# Patient Record
Sex: Female | Born: 1935 | Hispanic: No | Marital: Married
Health system: Southern US, Community
[De-identification: ages and names within clinical notes are randomized; demographics above are authoritative.]

## PROBLEM LIST (undated history)

## (undated) DIAGNOSIS — F329 Major depressive disorder, single episode, unspecified: Secondary | ICD-10-CM

## (undated) DIAGNOSIS — I10 Essential (primary) hypertension: Secondary | ICD-10-CM

## (undated) DIAGNOSIS — R609 Edema, unspecified: Secondary | ICD-10-CM

## (undated) DIAGNOSIS — F32A Depression, unspecified: Secondary | ICD-10-CM

## (undated) DIAGNOSIS — M109 Gout, unspecified: Secondary | ICD-10-CM

## (undated) DIAGNOSIS — K219 Gastro-esophageal reflux disease without esophagitis: Secondary | ICD-10-CM

## (undated) DIAGNOSIS — F0390 Unspecified dementia without behavioral disturbance: Secondary | ICD-10-CM

## (undated) DIAGNOSIS — E039 Hypothyroidism, unspecified: Secondary | ICD-10-CM

## (undated) DIAGNOSIS — G629 Polyneuropathy, unspecified: Secondary | ICD-10-CM

---
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
## 2018-07-13 DIAGNOSIS — U071 COVID-19: Secondary | ICD-10-CM

## 2018-07-13 HISTORY — DX: COVID-19: U07.1

## 2019-03-07 ENCOUNTER — Emergency Department (HOSPITAL_COMMUNITY): Payer: Medicare Other

## 2019-03-07 ENCOUNTER — Emergency Department (HOSPITAL_COMMUNITY)
Admission: EM | Admit: 2019-03-07 | Discharge: 2019-03-07 | Disposition: A | Discharge status: Home / Self Care | Payer: Medicare Other | Attending: Emergency Medicine | Admitting: Emergency Medicine

## 2019-03-07 DIAGNOSIS — Y999 Unspecified external cause status: Secondary | ICD-10-CM | POA: Diagnosis not present

## 2019-03-07 DIAGNOSIS — S61401A Unspecified open wound of right hand, initial encounter: Secondary | ICD-10-CM | POA: Diagnosis not present

## 2019-03-07 DIAGNOSIS — Z23 Encounter for immunization: Secondary | ICD-10-CM | POA: Insufficient documentation

## 2019-03-07 DIAGNOSIS — F0390 Unspecified dementia without behavioral disturbance: Secondary | ICD-10-CM | POA: Diagnosis not present

## 2019-03-07 DIAGNOSIS — Z79899 Other long term (current) drug therapy: Secondary | ICD-10-CM | POA: Diagnosis not present

## 2019-03-07 DIAGNOSIS — Y939 Activity, unspecified: Secondary | ICD-10-CM | POA: Diagnosis not present

## 2019-03-07 DIAGNOSIS — W2209XA Striking against other stationary object, initial encounter: Secondary | ICD-10-CM | POA: Insufficient documentation

## 2019-03-07 DIAGNOSIS — R451 Restlessness and agitation: Secondary | ICD-10-CM | POA: Diagnosis not present

## 2019-03-07 DIAGNOSIS — S60221A Contusion of right hand, initial encounter: Secondary | ICD-10-CM | POA: Diagnosis not present

## 2019-03-07 DIAGNOSIS — R2231 Localized swelling, mass and lump, right upper limb: Secondary | ICD-10-CM | POA: Diagnosis present

## 2019-03-07 DIAGNOSIS — Y92129 Unspecified place in nursing home as the place of occurrence of the external cause: Secondary | ICD-10-CM | POA: Diagnosis not present

## 2019-03-07 DIAGNOSIS — S61411A Laceration without foreign body of right hand, initial encounter: Secondary | ICD-10-CM

## 2019-03-07 MED ORDER — LORAZEPAM 2 MG/ML IJ SOLN
INTRAMUSCULAR | Status: AC
  Administered 2019-03-07: 1 mg
  Filled 2019-03-07: qty 1

## 2019-03-07 MED ORDER — TETANUS-DIPHTH-ACELL PERTUSSIS 5-2.5-18.5 LF-MCG/0.5 IM SUSP
0.5000 mL | Freq: Once | INTRAMUSCULAR | Status: AC
  Administered 2019-03-07: 0.5 mL via INTRAMUSCULAR
  Filled 2019-03-07: qty 0.5

## 2019-03-07 MED ORDER — LIDOCAINE-EPINEPHRINE (PF) 2 %-1:200000 IJ SOLN
20.0000 mL | Freq: Once | INTRAMUSCULAR | Status: AC
  Administered 2019-03-07: 20 mL via INTRADERMAL
  Filled 2019-03-07: qty 20

## 2019-03-07 MED ORDER — HALOPERIDOL LACTATE 5 MG/ML IJ SOLN
2.0000 mg | Freq: Once | INTRAMUSCULAR | Status: AC
  Administered 2019-03-07: 15:00:00 2 mg via INTRAVENOUS
  Filled 2019-03-07: qty 1

## 2019-03-07 MED ORDER — HALOPERIDOL LACTATE 5 MG/ML IJ SOLN
2.0000 mg | Freq: Once | INTRAMUSCULAR | Status: AC
  Administered 2019-03-07: 14:00:00 2 mg via INTRAVENOUS
  Filled 2019-03-07: qty 1

## 2019-03-07 MED ORDER — LIDOCAINE-EPINEPHRINE-TETRACAINE (LET) TOPICAL GEL
3.0000 mL | Freq: Once | TOPICAL | Status: AC
  Administered 2019-03-07: 3 mL via TOPICAL
  Filled 2019-03-07: qty 3

## 2019-03-07 MED ORDER — LIDOCAINE-EPINEPHRINE 1 %-1:100000 IJ SOLN
10.0000 mL | Freq: Once | INTRAMUSCULAR | Status: DC
  Filled 2019-03-07: qty 10

## 2019-03-07 MED ORDER — CEPHALEXIN 500 MG PO CAPS: 500.0000 mg | ORAL_CAPSULE | Freq: Two times a day (BID) | ORAL | 0 refills | Status: AC

## 2019-03-07 MED ORDER — LORAZEPAM 2 MG/ML IJ SOLN
1.0000 mg | Freq: Once | INTRAMUSCULAR | Status: AC
  Administered 2019-03-07: 17:00:00 1 mg via INTRAVENOUS
  Filled 2019-03-07: qty 1

## 2019-03-07 MED ORDER — LIDOCAINE-EPINEPHRINE-TETRACAINE (LET) SOLUTION: 3.0000 mL | Freq: Once | NASAL | Status: DC

## 2019-03-07 NOTE — Consult Note (Addendum)
Reason for Consult:Right hand lac Referring Physician: R Little  Monica Alvarado is an 86 y.o. female.  HPI: Monica Alvarado apparently hit her hand on something this morning as staff at the facility where she resides noticed a small hematoma. It quickly expanded over the next several hours with significant ecchymosis and swelling. She was brought in to the ED and, on the way in via EMS, hit it on something which caused the skin to split. Hand surgery was consulted. She is severely demented and very combative so couldn't contribute to history or exam.  No past medical history on file.  No family history on file.  Social History:  has no history on file for tobacco, alcohol, and drug.  Allergies: No Known Allergies  Medications: I have reviewed the patient's current medications.  No results found for this or any previous visit (from the past 48 hour(s)).  DG Hand Complete Right  Result Date: 03/07/2019 CLINICAL DATA:  Right hand swelling EXAM: RIGHT HAND - COMPLETE 3+ VIEW COMPARISON:  None. FINDINGS: Soft tissue swelling dorsum of hand. Wound in overlying bandage noted. No gas in the soft tissues Negative for fracture. Negative for osteomyelitis. Calcification in the triangular fibrocartilage. Mild degenerative changes in the fingers. IMPRESSION: Dorsal soft tissue swelling of the hand and wrist. No acute skeletal abnormality. Electronically Signed   By: Marlan Palau M.D.   On: 03/07/2019 15:18    Review of Systems  Unable to perform ROS: Dementia   Blood pressure 136/77, pulse (!) 102, resp. rate 18, SpO2 95 %. Physical Exam  Constitutional: She appears well-developed and well-nourished. No distress.  HENT:  Head: Normocephalic and atraumatic.  Eyes: Conjunctivae are normal. Right eye exhibits no discharge. Left eye exhibits no discharge. No scleral icterus.  Cardiovascular: Normal rate and regular rhythm.  Respiratory: Effort normal. No respiratory distress.  Musculoskeletal:     Cervical  back: Normal range of motion.     Comments: Right shoulder, elbow, wrist, digits- Unable to complete PE 2/2 combativeness  Neurological: She is alert.  Skin: Skin is warm and dry. She is not diaphoretic.  Psychiatric: She has a normal mood and affect. Her behavior is normal.    Assessment/Plan: Right hand lac -- Could consider partial approximation with 4-0 vicryl or similar to decrease healing time but risk may outweigh benefit. In any event would suggest early referral to wound care center as she'll need ongoing treatment for this. No role for operative tx at this juncture.    Freeman Caldron, PA-C Orthopedic Surgery [phone] 03/07/2019, 3:31 PM   Right hand dorsal first webspace linear skin tear, overlying previously formed hematoma.  Standard loose wound closure and wound care recommended.  Will follow progress of wound closure with evaluation next week in the office.

## 2019-03-07 NOTE — Progress Notes (Signed)
Orthopedic Tech Progress Note Patient Details:  Anglea Gordner [birth year] [id number] I applied a XL thumb spica because of how big the dressing was on patient hand Ortho Devices Type of Ortho Device: Thumb velcro splint Ortho Device/Splint Location: RUE Ortho Device/Splint Interventions: Application, Ordered   Post Interventions Patient Tolerated: Well Instructions Provided: Care of device, Adjustment of device   Donald Pore 03/07/2019, 5:58 PM

## 2019-03-07 NOTE — ED Provider Notes (Signed)
Monica Alvarado Provider Note   CSN: [id number] Arrival date & time: 03/07/19  1354     History Chief Complaint  Patient presents with  . Arm Injury    Monica Alvarado is a 86 y.o. female.  86yo F w/ PMH including dementia who p/w R hand problem. Nursing facility reports that this morning, staff noticed a small area of swelling on her dorsal R hand. Later in the day, they noticed that her whole hand had become swollen and bruised. They are not aware of trauma, but patient has dementia. EMS was called due to hand swelling. During transfer while they were preparing to take her from facility, she somehow bumped her hand and her skin split, causing bleeding from dorsal hand and now a gaping wound. Her med list from facility did not note any blood thinners. She has been combative and spitting during transport, requiring 5mg  haldol.  LEVEL 5 CAVEAT DUE TO DEMENTIA  The history is provided by the EMS personnel.       No past medical history on file.  There are no problems to display for this patient.   * The histories are not reviewed yet. Please review them in the "History" navigator section and refresh this SmartLink.   OB History   No obstetric history on file.     No family history on file.  Social History   Tobacco Use  . Smoking status: Not on file  Substance Use Topics  . Alcohol use: Not on file  . Drug use: Not on file    Home Medications Prior to Admission medications   Medication Sig Start Date End Date Taking? Authorizing Provider  acetaminophen (TYLENOL) 325 MG tablet Take 650 mg by mouth 3 (three) times daily. For joint pain   Yes [provider]  allopurinol (ZYLOPRIM) 300 MG tablet Take 300 mg by mouth daily. 01/27/19  Yes [provider]  amLODipine (NORVASC) 10 MG tablet Take 10 mg by mouth daily. 02/02/19  Yes [provider]  aspirin EC 81 MG tablet Take 81 mg by mouth daily.   Yes [provider]  busPIRone (BUSPAR) 5 MG tablet Take 2.5-5 mg by mouth See admin instructions. Take 1/2 tablet (2.5 mg) by mouth every morning and 1 tablet (5 mg) at bedtime, may also take 1 tablet (5 mg) every 12 hours as needed for anxiety 02/25/19  Yes [provider]  cholecalciferol (VITAMIN D3) 25 MCG (1000 UNIT) tablet Take 1,000 Units by mouth daily.   Yes [provider]  divalproex (DEPAKOTE) 125 MG DR tablet Take 125 mg by mouth at bedtime. 03/02/19  Yes [provider]  docusate sodium (COLACE) 100 MG capsule Take 100 mg by mouth 2 (two) times daily.   Yes [provider]  levothyroxine (SYNTHROID) 25 MCG tablet Take 25 mcg by mouth daily before breakfast.  01/27/19  Yes [provider]  loperamide (IMODIUM) 2 MG capsule Take 2 mg by mouth every 12 (twelve) hours as needed for diarrhea or loose stools.   Yes [provider]  LORazepam (ATIVAN) 0.5 MG tablet Take 0.5 mg by mouth See admin instructions. Take 1 tablet (0.5 mg) by mouth every 24 hours as needed for refusal of shower. If patient refuses shower wait one hour after administration before attempting again 03/03/19  Yes [provider]  Melatonin 5 MG TABS Take 5 mg by mouth at bedtime.   Yes [provider]  Menthol-Zinc Oxide 03/05/19)  0.44-20.6 % OINT Apply 1 application topically See admin instructions. Apply topically to buttocks every day and evening shift for redness   Yes [provider]  sertraline (ZOLOFT) 25 MG tablet Take 25 mg by mouth See admin instructions. Ordered 02/24/2019: take one tablet (25 mg) by mouth every morning for 2 weeks, then start 50 mg tablet daily 02/23/19  Yes [provider]  traZODone (DESYREL) 50 MG tablet Take 50 mg by mouth at bedtime. 02/19/19  Yes [provider]  sertraline (ZOLOFT) 50 MG tablet Take 50 mg by mouth daily.    [provider]    Allergies    Patient has no known  allergies.  Review of Systems   Review of Systems  Unable to perform ROS: Dementia    Physical Exam Updated Vital Signs BP 136/77 (BP Location: Left Arm)   Pulse (!) 102   Resp 18   SpO2 95%   Physical Exam Vitals and nursing note reviewed.  Constitutional:      General: She is not in acute distress.    Appearance: She is well-developed.     Comments: Frail, elderly woman   HENT:     Head: Normocephalic and atraumatic.  Eyes:     Conjunctiva/sclera: Conjunctivae normal.  Cardiovascular:     Pulses: Normal pulses.  Musculoskeletal:        General: Swelling present.     Cervical back: Neck supple.     Comments: Significant edema and ecchymosis of entire dorsal R hand extending onto fingers w/ 5cm laceration on dorsal hand near base of thumb, not currently bleeding  Skin:    General: Skin is warm and dry.     Findings: Bruising present.  Neurological:     Mental Status: She is alert.     Comments: Alert, disoriented  Psychiatric:     Comments: Aggressive, angry       ED Results / Procedures / Treatments   Labs (all labs ordered are listed, but only abnormal results are displayed) Labs Reviewed - No data to display  EKG None  Radiology DG Hand Complete Right  Result Date: 03/07/2019 CLINICAL DATA:  Right hand swelling EXAM: RIGHT HAND - COMPLETE 3+ VIEW COMPARISON:  None. FINDINGS: Soft tissue swelling dorsum of hand. Wound in overlying bandage noted. No gas in the soft tissues Negative for fracture. Negative for osteomyelitis. Calcification in the triangular fibrocartilage. Mild degenerative changes in the fingers. IMPRESSION: Dorsal soft tissue swelling of the hand and wrist. No acute skeletal abnormality. Electronically Signed   By: Marlan Palau M.D.   On: 03/07/2019 15:18    Procedures Procedures (including critical care time)  Medications Ordered in ED Medications  LORazepam (ATIVAN) 2 MG/ML injection (has no administration in time range)   lidocaine-EPINEPHrine (XYLOCAINE W/EPI) 2 %-1:200000 (PF) injection 20 mL (has no administration in time range)  lidocaine-EPINEPHrine-tetracaine (LET) topical gel (has no administration in time range)  lidocaine-EPINEPHrine (XYLOCAINE W/EPI) 1 %-1:100000 (with pres) injection 10 mL (has no administration in time range)  haloperidol lactate (HALDOL) injection 2 mg (2 mg Intravenous Given 03/07/19 1429)  Tdap (BOOSTRIX) injection 0.5 mL (0.5 mLs Intramuscular Given 03/07/19 1503)  haloperidol lactate (HALDOL) injection 2 mg (2 mg Intravenous Given 03/07/19 1503)    ED Course  I have reviewed the triage vital signs and the nursing notes.  Pertinent imaging results that were available during my care of the patient were reviewed by me and considered in my medical decision making (see chart for  details).    MDM Rules/Calculators/A&P                      No arterial bleeding on exam. I suspect she may have had an initial trauma that led to hematoma, and due to skin tension from edema, small amount of trauma w/ EMS led to rupture of skin overlying the hematoma. XR negative. Discussed wound w/ hand, Silvestre Gunner who staffed w/ Dr. Grandville Silos. They have recommended attempt at reapproximation w/ mattress sutures. Discussed this option vs healing secondarily w/ pt's daughter. Had long discussion w/ daughter regarding risks of repair including failure of sutures, risks of sedation; vs risks of healing secondarily. Daughter has requested repair. Pt signed out to oncoming team pending repair and splinting. Final Clinical Impression(s) / ED Diagnoses Final diagnoses:  None    Rx / DC Orders ED Discharge Orders    None       October Peery, Wenda Overland, MD 03/07/19 [phone]

## 2019-03-07 NOTE — ED Provider Notes (Signed)
Transfer of Care Note I assumed care of Monica Alvarado on 03/07/2019  Briefly, Monica Alvarado is a 86 y.o. female who:  Presents with large gaping hand wound  XR negative  Demented, elderly  Sedated with haldol, ativan, still agitated  The plan includes: Re-dose ativan, continue to assess  Loose approximation of wound with large wet/dry dressing Dc with keflex and wound care f/u   Please refer to the original provider's note for additional information regarding the care of Monica Alvarado.  ### Reassessment: I personally reassessed the patient:  Vital Signs:  The most current vitals were  Vitals:   03/07/19 1700 03/07/19 1815  BP: (!) 87/53 (!) 109/59  Pulse:    Resp: 14 14  SpO2:      Hemodynamics:  The patient is hemodynamically stable. Mental Status:  The patient is Alert  Additional MDM: LACERATION REPAIR Performed by: Jamey Reas Authorized by: Dr. Bruce Donath  Consent: Verbal consent obtained. Risks and benefits: risks, benefits and alternatives were discussed Consent given by: patient Patient identity confirmed: provided demographic data Prepped and Draped in normal sterile fashion Wound explored  Laceration Location: right hand  Laceration Length: 4cm  No Foreign Bodies seen or palpated  Anesthesia: local infiltration  Local anesthetic: lidocaine 1% w epinephrine  Anesthetic total: 10 ml  Irrigation method: syringe Amount of cleaning: standard  Skin closure: Yes  Number of sutures: 3  Technique: horizontal mattress   Patient tolerance: Patient tolerated the procedure well with no immediate complications.  Wound is approximated as close as possible without tearing the skin, sterile wet dressing was applied, patient tolerated well. Wound care instructions were given to the daughter, discharged home with Keflex. Patient need aggressive wound discharged home in good condition with transport back to facility. Care over the next few days, this was  communicated to the patient's care facility. Return precautions were given to the daughter, discharge back to facility with transport.  The attending physician was present and available for all medical decision making and procedures related to this patient's care.       Jamey Reas, MD 03/07/19 Darnell Level    Lorre Nick, MD 03/08/19 [phone]

## 2019-03-07 NOTE — ED Notes (Signed)
[facility] called around 1910 for transport

## 2019-03-07 NOTE — ED Notes (Signed)
Updated patient's nursing home

## 2019-03-07 NOTE — ED Notes (Signed)
Glendell Docker (Daughter/Guardian#[phone]) called on her way over/wants to know if she can visit.

## 2019-03-07 NOTE — ED Triage Notes (Signed)
Pt bib ems from [facility] with reports of ? Arm abcess to R. Arm is purple in color with bandage in place. Pt with hx dementia and is combative en route. Given 5mg  haldol by ems.

## 2019-03-07 NOTE — ED Notes (Signed)
Report called to [facility] by previous nurse without answer. She left message on the machine with information. Daughter verbalized understanding. Pt leaving with [facility]

## 2019-04-20 ENCOUNTER — Emergency Department (HOSPITAL_COMMUNITY)
Admission: EM | Admit: 2019-04-20 | Discharge: 2019-04-20 | Disposition: A | Discharge status: Skilled Nursing Facility | Payer: Medicare Other | Attending: Emergency Medicine | Admitting: Emergency Medicine

## 2019-04-20 ENCOUNTER — Emergency Department (HOSPITAL_COMMUNITY): Payer: Medicare Other

## 2019-04-20 ENCOUNTER — Other Ambulatory Visit: Payer: Self-pay

## 2019-04-20 ENCOUNTER — Encounter (HOSPITAL_COMMUNITY): Payer: Self-pay

## 2019-04-20 DIAGNOSIS — Y9389 Activity, other specified: Secondary | ICD-10-CM | POA: Diagnosis not present

## 2019-04-20 DIAGNOSIS — R451 Restlessness and agitation: Secondary | ICD-10-CM | POA: Insufficient documentation

## 2019-04-20 DIAGNOSIS — Y999 Unspecified external cause status: Secondary | ICD-10-CM | POA: Insufficient documentation

## 2019-04-20 DIAGNOSIS — S61411A Laceration without foreign body of right hand, initial encounter: Secondary | ICD-10-CM

## 2019-04-20 DIAGNOSIS — Z79899 Other long term (current) drug therapy: Secondary | ICD-10-CM | POA: Diagnosis not present

## 2019-04-20 DIAGNOSIS — Z8616 Personal history of COVID-19: Secondary | ICD-10-CM | POA: Insufficient documentation

## 2019-04-20 DIAGNOSIS — S91211A Laceration without foreign body of right great toe with damage to nail, initial encounter: Secondary | ICD-10-CM | POA: Insufficient documentation

## 2019-04-20 DIAGNOSIS — Y92129 Unspecified place in nursing home as the place of occurrence of the external cause: Secondary | ICD-10-CM | POA: Insufficient documentation

## 2019-04-20 DIAGNOSIS — F0390 Unspecified dementia without behavioral disturbance: Secondary | ICD-10-CM | POA: Insufficient documentation

## 2019-04-20 DIAGNOSIS — W19XXXA Unspecified fall, initial encounter: Secondary | ICD-10-CM | POA: Insufficient documentation

## 2019-04-20 DIAGNOSIS — S91209A Unspecified open wound of unspecified toe(s) with damage to nail, initial encounter: Secondary | ICD-10-CM

## 2019-04-20 HISTORY — DX: Edema, unspecified: R60.9

## 2019-04-20 HISTORY — DX: Essential (primary) hypertension: I10

## 2019-04-20 HISTORY — DX: Gout, unspecified: M10.9

## 2019-04-20 HISTORY — DX: Polyneuropathy, unspecified: G62.9

## 2019-04-20 HISTORY — DX: Gastro-esophageal reflux disease without esophagitis: K21.9

## 2019-04-20 HISTORY — DX: Unspecified dementia, unspecified severity, without behavioral disturbance, psychotic disturbance, mood disturbance, and anxiety: F03.90

## 2019-04-20 HISTORY — DX: Depression, unspecified: F32.A

## 2019-04-20 HISTORY — DX: Hypothyroidism, unspecified: E03.9

## 2019-04-20 MED ORDER — LORAZEPAM 2 MG/ML IJ SOLN
2.0000 mg | Freq: Once | INTRAMUSCULAR | Status: AC
  Administered 2019-04-20: 2 mg via INTRAMUSCULAR
  Filled 2019-04-20: qty 1

## 2019-04-20 MED ORDER — HALOPERIDOL LACTATE 5 MG/ML IJ SOLN
2.0000 mg | Freq: Once | INTRAMUSCULAR | Status: AC
  Administered 2019-04-20: 2 mg via INTRAMUSCULAR
  Filled 2019-04-20: qty 1

## 2019-04-20 MED ORDER — HALOPERIDOL LACTATE 5 MG/ML IJ SOLN: 2.0000 mg | Freq: Once | INTRAMUSCULAR | Status: DC

## 2019-04-20 MED ORDER — LORAZEPAM 2 MG/ML IJ SOLN: 2.0000 mg | Freq: Once | INTRAMUSCULAR | Status: DC

## 2019-04-20 MED ORDER — HALOPERIDOL LACTATE 5 MG/ML IJ SOLN
5.0000 mg | Freq: Once | INTRAMUSCULAR | Status: AC
  Administered 2019-04-20: 5 mg via INTRAMUSCULAR
  Filled 2019-04-20: qty 1

## 2019-04-20 MED ORDER — LIDOCAINE HCL (PF) 1 % IJ SOLN
10.0000 mL | Freq: Once | INTRAMUSCULAR | Status: AC
  Administered 2019-04-20: 10 mL
  Filled 2019-04-20: qty 30

## 2019-04-20 NOTE — ED Provider Notes (Signed)
..  Laceration Repair  Date/Time: 04/20/2019 10:24 PM Performed by: Kirt Boys, MD Authorized by: Rolan Bucco, MD   Consent:    Consent obtained:  Verbal   Consent given by:  Guardian (Daughter)   Risks discussed:  Infection, pain, tendon damage, poor cosmetic result, need for additional repair, poor wound healing, nerve damage, retained foreign body and vascular damage Anesthesia (see MAR for exact dosages):    Anesthesia method:  Local infiltration   Local anesthetic:  Lidocaine 1% w/o epi Laceration details:    Location:  Finger   Finger location: L Long Finger, L Index Finger.   Wound length (cm): L Long - 2cm, L Index, 4 cm.   Depth (mm):  1 Repair type:    Repair type:  Simple Pre-procedure details:    Preparation:  Patient was prepped and draped in usual sterile fashion Exploration:    Hemostasis achieved with:  Direct pressure   Wound exploration: entire depth of wound probed and visualized     Wound extent: fascia violated     Wound extent: no areolar tissue violation noted, no foreign bodies/material noted, no muscle damage noted, no nerve damage noted, no tendon damage noted, no underlying fracture noted and no vascular damage noted     Contaminated: no   Treatment:    Area cleansed with:  Betadine   Amount of cleaning:  Standard   Irrigation solution:  Sterile saline   Irrigation method:  Syringe   Visualized foreign bodies/material removed: no   Skin repair:    Repair method:  Sutures   Suture size:  3-0   Suture material:  Prolene   Suture technique:  Simple interrupted   Number of sutures:  2 Approximation:    Approximation:  Close Post-procedure details:    Dressing:  Non-adherent dressing   Patient tolerance of procedure:  Tolerated with difficulty      Kirt Boys, MD 04/20/19 2230    Rolan Bucco, MD 04/20/19 [phone]

## 2019-04-20 NOTE — ED Notes (Signed)
Patient has dementia, patient's daughter verbalized understanding of discharge.

## 2019-04-20 NOTE — Discharge Instructions (Signed)
Keep the wounds clean and dry.  Wash them and change the dressings once daily.  Return here as needed if there is any worsening symptoms or signs of infection.

## 2019-04-20 NOTE — ED Triage Notes (Signed)
Pt BIB [facility] from [facility] after staff found pt on the floor following a fall. Pt has a laceration to right hand and great toe on right foot is bleeding. Pt has hx of dementia - per EMS pt is combative and spits. Staff is unaware if pt hit head or LOC. Pt removed C-Collar placed by EMS in the ambulance. When asked, pt states she hurts from "the top of my head to the bottom of my feet, Im 89."

## 2019-04-20 NOTE — ED Provider Notes (Signed)
[facility]-EMERGENCY DEPT Provider Note   CSN: [id number] Arrival date & time: 04/20/19  1603     History Chief Complaint  Patient presents with  . Fall    Monica Alvarado is a 86 y.o. female.  Patient is a 86 year old female who presents from a nursing home, [facility], after an unwitnessed fall.  History is limited due to her dementia.  I spoke with the nursing home staff who states that patient was found on the floor.  She has had a history of frequent falls recently.  She has had a lot of skin tears as well recently.  The staff states that patient is otherwise been acting at her baseline.  No fevers or other recent illnesses.  She is noted to have some lacerations to her right hand and an injury to her toe.        Past Medical History:  Diagnosis Date  . COVID-19 07/13/2018  . Dementia (HCC)   . Depression   . Edema   . GERD (gastroesophageal reflux disease)   . Gout   . Hypertension   . Hypothyroidism   . Polyneuropathy     There are no problems to display for this patient.   Past Surgical History:  Procedure Laterality Date  . ABDOMINAL HYSTERECTOMY       OB History   No obstetric history on file.     History reviewed. No pertinent family history.  Social History   Tobacco Use  . Smoking status: Not on file  Substance Use Topics  . Alcohol use: Not on file  . Drug use: Not on file    Home Medications Prior to Admission medications   Medication Sig Start Date End Date Taking? Authorizing Provider  acetaminophen (TYLENOL) 325 MG tablet Take 650 mg by mouth 3 (three) times daily. For joint pain    [provider]  allopurinol (ZYLOPRIM) 300 MG tablet Take 300 mg by mouth daily. 01/27/19   [provider]  amLODipine (NORVASC) 10 MG tablet Take 10 mg by mouth daily. 02/02/19   [provider]  aspirin EC 81 MG tablet Take 81 mg by mouth daily.    [provider]  busPIRone (BUSPAR) 5 MG  tablet Take 2.5-5 mg by mouth See admin instructions. Take 1/2 tablet (2.5 mg) by mouth every morning and 1 tablet (5 mg) at bedtime, may also take 1 tablet (5 mg) every 12 hours as needed for anxiety 02/25/19   [provider]  cephALEXin (KEFLEX) 500 MG capsule Take 1 capsule (500 mg total) by mouth 2 (two) times daily. 03/07/19   Little, Ambrose Finland, MD  cholecalciferol (VITAMIN D3) 25 MCG (1000 UNIT) tablet Take 1,000 Units by mouth daily.    [provider]  divalproex (DEPAKOTE) 125 MG DR tablet Take 125 mg by mouth at bedtime. 03/02/19   [provider]  docusate sodium (COLACE) 100 MG capsule Take 100 mg by mouth 2 (two) times daily.    [provider]  levothyroxine (SYNTHROID) 25 MCG tablet Take 25 mcg by mouth daily before breakfast.  01/27/19   [provider]  loperamide (IMODIUM) 2 MG capsule Take 2 mg by mouth every 12 (twelve) hours as needed for diarrhea or loose stools.    [provider]  LORazepam (ATIVAN) 0.5 MG tablet Take 0.5 mg by mouth See admin instructions. Take 1 tablet (0.5 mg) by mouth every 24 hours as needed for refusal of shower. If patient refuses shower  wait one hour after administration before attempting again 03/03/19   [provider]  Melatonin 5 MG TABS Take 5 mg by mouth at bedtime.    [provider]  Menthol-Zinc Oxide (CALMOSEPTINE) 0.44-20.6 % OINT Apply 1 application topically See admin instructions. Apply topically to buttocks every day and evening shift for redness    [provider]  sertraline (ZOLOFT) 25 MG tablet Take 25 mg by mouth See admin instructions. Ordered 02/24/2019: take one tablet (25 mg) by mouth every morning for 2 weeks, then start 50 mg tablet daily 02/23/19   [provider]  sertraline (ZOLOFT) 50 MG tablet Take 50 mg by mouth daily.    [provider]  traZODone (DESYREL) 50 MG tablet Take 50 mg by mouth at bedtime. 02/19/19   [provider]    Allergies    Patient has no known allergies.  Review of Systems   Review of Systems  Unable to perform ROS: Dementia    Physical Exam Updated Vital Signs BP (!) 151/95   Pulse (!) 102   Temp 98.7 F (37.1 C) (Oral)   Resp 18   SpO2 99%   Physical Exam HENT:     Head: Normocephalic and atraumatic.     Mouth/Throat:     Mouth: Mucous membranes are moist.  Eyes:     Conjunctiva/sclera: Conjunctivae normal.     Pupils: Pupils are equal, round, and reactive to light.  Neck:     Comments: No obvious tenderness on palpation of the cervical, thoracic or lumbosacral spine Cardiovascular:     Rate and Rhythm: Normal rate.     Pulses: Normal pulses.     Heart sounds: Normal heart sounds.  Pulmonary:     Effort: Pulmonary effort is normal.     Breath sounds: Normal breath sounds.  Abdominal:     General: Abdomen is flat.     Tenderness: There is no abdominal tenderness.  Musculoskeletal:        General: Normal range of motion.     Comments: No pain on palpation or range of motion of her extremities, she does have loss of her right great toenail.  There is no active bleeding.  No swelling or deformity noted.  No other injury noted to the foot.  She has an older appearing skin tear to her right mid forearm which is covered with a dressing.  There is no active bleeding.  There is some exudative material noted.  She has 1 similar lacerations to the dorsal surface of her second and third fingers.  No active bleeding is noted.  There does not appear to be any obvious tendon injury.  She has full range of motion in her hands.  Sensation is difficult to assess.  She has an older appearing abrasion on her upper back.  No signs of surrounding infection.  Skin:    General: Skin is warm and dry.  Neurological:     General: No focal deficit present.     Mental Status: She is alert.     Comments: Patient is awake and communicative but disoriented     ED Results /  Procedures / Treatments   Labs (all labs ordered are listed, but only abnormal results are displayed) Labs Reviewed - No data to display  EKG None  Radiology CT Head Wo Contrast  Result Date: 04/20/2019 CLINICAL DATA:  Found on floor after fall EXAM: CT HEAD WITHOUT CONTRAST CT CERVICAL SPINE WITHOUT CONTRAST TECHNIQUE: Multidetector CT  imaging of the head and cervical spine was performed following the standard protocol without intravenous contrast. Multiplanar CT image reconstructions of the cervical spine were also generated. COMPARISON:  None. FINDINGS: CT HEAD FINDINGS Brain: No acute territorial infarction, hemorrhage or intracranial mass. Moderate atrophy. Mild hypodensity in the white matter consistent with chronic small vessel ischemic change. Mild ventricular enlargement felt secondary to atrophy. Vascular: No hyperdense vessels. Vertebral and carotid vascular calcification Skull: Normal. Negative for fracture or focal lesion. Sinuses/Orbits: No acute finding. Other: None CT CERVICAL SPINE FINDINGS Alignment: Trace anterolisthesis C4 on C5, likely degenerative. Facet alignment is normal. Skull base and vertebrae: No acute fracture. No primary bone lesion or focal pathologic process. Soft tissues and spinal canal: No prevertebral fluid or swelling. No visible canal hematoma. Disc levels: Mild to moderate degenerative change C4-C5 and C5-C6. Prominent calcification in the posterior spinal canal on the left at C4-C5. Facet degenerative changes at multiple levels results in multiple level foraminal stenosis. Bulky calcified degenerative change and pannus at the C1-C2 articulation. Upper chest: Negative. Other: None IMPRESSION: 1. No CT evidence for acute intracranial abnormality. 2. Atrophy and chronic small vessel ischemic change of the white matter 3. Degenerative changes of the cervical spine. No acute osseous abnormality Electronically Signed   By: Jasmine Pang M.D.   On: 04/20/2019 17:55   CT  Cervical Spine Wo Contrast  Result Date: 04/20/2019 CLINICAL DATA:  Found on floor after fall EXAM: CT HEAD WITHOUT CONTRAST CT CERVICAL SPINE WITHOUT CONTRAST TECHNIQUE: Multidetector CT imaging of the head and cervical spine was performed following the standard protocol without intravenous contrast. Multiplanar CT image reconstructions of the cervical spine were also generated. COMPARISON:  None. FINDINGS: CT HEAD FINDINGS Brain: No acute territorial infarction, hemorrhage or intracranial mass. Moderate atrophy. Mild hypodensity in the white matter consistent with chronic small vessel ischemic change. Mild ventricular enlargement felt secondary to atrophy. Vascular: No hyperdense vessels. Vertebral and carotid vascular calcification Skull: Normal. Negative for fracture or focal lesion. Sinuses/Orbits: No acute finding. Other: None CT CERVICAL SPINE FINDINGS Alignment: Trace anterolisthesis C4 on C5, likely degenerative. Facet alignment is normal. Skull base and vertebrae: No acute fracture. No primary bone lesion or focal pathologic process. Soft tissues and spinal canal: No prevertebral fluid or swelling. No visible canal hematoma. Disc levels: Mild to moderate degenerative change C4-C5 and C5-C6. Prominent calcification in the posterior spinal canal on the left at C4-C5. Facet degenerative changes at multiple levels results in multiple level foraminal stenosis. Bulky calcified degenerative change and pannus at the C1-C2 articulation. Upper chest: Negative. Other: None IMPRESSION: 1. No CT evidence for acute intracranial abnormality. 2. Atrophy and chronic small vessel ischemic change of the white matter 3. Degenerative changes of the cervical spine. No acute osseous abnormality Electronically Signed   By: Jasmine Pang M.D.   On: 04/20/2019 17:55   DG Hand Complete Right  Result Date: 04/20/2019 CLINICAL DATA:  Pain and swelling EXAM: RIGHT HAND - COMPLETE 3+ VIEW COMPARISON:  03/07/2019 FINDINGS: There is  no acute displaced fracture or dislocation. Degenerative changes are noted of the interphalangeal joints and radiocarpal joint. Degenerative changes are noted at the first [facility]. Soft tissue swelling is noted about the dorsal aspect of the hand which has significantly improved from prior study. IMPRESSION: No acute osseous abnormality. Electronically Signed   By: Katherine Mantle M.D.   On: 04/20/2019 17:54   DG Toe Great Right  Result Date: 04/20/2019 CLINICAL DATA:  Toenail injury. EXAM: RIGHT GREAT  TOE COMPARISON:  None. FINDINGS: There are advanced degenerative changes of first metatarsophalangeal joint. There is no acute displaced fracture. There is no dislocation. IMPRESSION: 1. No acute displaced fracture or dislocation. 2. Advanced degenerative changes are noted of the first metatarsophalangeal joint. Electronically Signed   By: Katherine Mantle M.D.   On: 04/20/2019 17:52    Procedures Procedures (including critical care time)  Medications Ordered in ED Medications  haloperidol lactate (HALDOL) injection 5 mg (5 mg Intramuscular Given 04/20/19 1659)  lidocaine (PF) (XYLOCAINE) 1 % injection 10 mL (10 mLs Infiltration Given by Other 04/20/19 1856)  haloperidol lactate (HALDOL) injection 2 mg (2 mg Intramuscular Given 04/20/19 1854)  LORazepam (ATIVAN) injection 2 mg (2 mg Intramuscular Given 04/20/19 2021)    ED Course  I have reviewed the triage vital signs and the nursing notes.  Pertinent labs & imaging results that were available during my care of the patient were reviewed by me and considered in my medical decision making (see chart for details).    MDM Rules/Calculators/A&P                      No evidence of bony injuries or ICH.  Patient's tetanus shot is up-to-date.  She did require sedation to repair her hand wounds.  These were repaired by Kirt Boys, MD, resident.  Wound care instructions were given to the nursing facility.  Return precautions given. Final Clinical  Impression(s) / ED Diagnoses Final diagnoses:  Fall, initial encounter  Laceration of right hand without foreign body, initial encounter  Toenail avulsion, initial encounter    Rx / DC Orders ED Discharge Orders    None       Rolan Bucco, MD 04/20/19 2222

## 2019-04-20 NOTE — ED Notes (Signed)
Pt transported to CT/Xray. 

## 2019-04-26 ENCOUNTER — Other Ambulatory Visit: Payer: Self-pay

## 2019-04-26 ENCOUNTER — Non-Acute Institutional Stay: Payer: Medicare Other | Admitting: Internal Medicine

## 2019-04-26 DIAGNOSIS — Z515 Encounter for palliative care: Secondary | ICD-10-CM

## 2019-04-27 NOTE — Progress Notes (Signed)
Late entry:   Patient was rescheduled d/t provider conflict in schedule. I rescheduled patient to Friday March 12th.  Holly Bodily NP-C [facility] [phone]

## 2019-04-28 NOTE — Progress Notes (Signed)
March 12th, 2021 [facility] Palliative Care Consult Note Telephone: [phone]  Fax: [phone]  PATIENT NAME: Monica Alvarado DOB: Mar 20, 1921 MRN: [id number] [facility] RM [address] [facility] (move in date 02/23/2018) PRIMARY CARE PROVIDER:   Reymundo Poll, MD / [facility])  [place] FNP (03/23/2019)  REFERRING PROVIDER:  Reymundo Poll, MD ([phone]. (F) [phone] [address] [place],  [place] [address]  RESPONSIBLE PARTY: (dtr) Cookie Baynes (M) [phone] [email or web address]. (son) Maximino Greenland [phone]. (son) Inez Catalina [phone]. (dtr) England Greb [phone]  ASSESSMENT / RECOMMENDATIONS:  1. Advance Care Planning: A. Directives: DNR and MOST form in facility chart and [facility] EMR. MOST: DNR/DNI, yes to Antibiotics and IVFs, No to Feeding Tube. B. Goals of Care: Will discuss with family when I am able to contact. Patient unable to participate d/t dementia   2. Cognitive / Functional status: Patient is constantly confused. She is oriented to self only. She strings together non-sensical sentences. He ability to communicate waxes and wanes, but she usually can communicate her needs. Staff report patient with visual and auditory hallucinations the are sometimes anxiety provoking. She is resistive to bathing and will strike out and attempt to scratch staff. She is followed by Psych nursing and behaviors seem better managed (Escitalopram 10mg , divalproex sodium 125mg  bid, and Ativan 0.5mg  qd prn). She is increasingly somnolent, awake only 4-6 hrs a day.  Patient has had a decline over the last month. Previously able to ambulate with PT and was independent with toileting. Now is non-ambulatory and is a 1-person transfer to wheelchair. She can stand only a few seconds. She needs to be propped up in the wheelchair b/c she will slump to the side. She has had 4 falls this past month. When first admitted 15 months ago she  could ambulate independently. She is dependent for hygiene and dressing. She is incontinent of bowel and bladder. Staff repost patient can feed herself finger food, but constant cuing. Last weight (04/13/2019) was 126.2 lbs, which is a decrease of 12.6lbs (9% of her body weight) over the previous 2 months, and 21.8lbs (15% of body weight) over the previous 6 months. At a height of 5'4" her BMI is 21.7kg/m2. She has a laceration to her L middle and index finger from a fall earlier this month.   3. Family Supports: Patient admitted to the facility 75months ago. Involved daughter.    4. Follow up Palliative Care Visit: in the next month or so. I spent 60 minutes providing this consultation from 9:30am to 10:30am. More than 50% of the time in this consultation was spent coordinating communication.   HISTORY OF PRESENT ILLNESS:  Jamise Pentland is a 86 y.o. female with Alzheimer's dementia, falls, major depressive disorder, polyneuropathy, HTN, GERD, gout, TIA, hypothyroidism. -03/08/2019 ER. Swollen hand then injury. Had repair R hand wound -04/20/2019 ER after fall, repair of laceration L long finger and L index finger. Palliative Care was asked to help address goals of care.   CODE STATUS: DNR  PPS: 30%  HOSPICE ELIGIBILITY/DIAGNOSIS: TBD  PAST MEDICAL HISTORY:  Past Medical History:  Diagnosis Date  . COVID-19 07/13/2018  . Dementia ([place])   . Depression   . Edema   . GERD (gastroesophageal reflux disease)   . Gout   . Hypertension   . Hypothyroidism   . Polyneuropathy     SOCIAL HX:  Social History   Tobacco Use  .  Smoking status: Not on file  Substance Use Topics  . Alcohol use: Not on file    ALLERGIES: No Known Allergies   PERTINENT MEDICATIONS:  Outpatient Encounter Medications as of 04/29/2019  Medication Sig  . acetaminophen (TYLENOL) 325 MG tablet Take 650 mg by mouth 3 (three) times daily. For joint pain  . allopurinol (ZYLOPRIM) 300 MG tablet Take 300 mg by mouth daily.  Marland Kitchen  amLODipine (NORVASC) 10 MG tablet Take 10 mg by mouth daily.  Marland Kitchen aspirin EC 81 MG tablet Take 81 mg by mouth daily.  . cephALEXin (KEFLEX) 500 MG capsule Take 1 capsule (500 mg total) by mouth 2 (two) times daily. (Patient taking differently: Take 500 mg by mouth 2 (two) times daily. 04/27/2019 for 5d)  . cholecalciferol (VITAMIN D3) 25 MCG (1000 UNIT) tablet Take 1,000 Units by mouth daily.  . divalproex (DEPAKOTE) 125 MG DR tablet Take 125 mg by mouth 2 (two) times daily.   Marland Kitchen docusate sodium (COLACE) 100 MG capsule Take 100 mg by mouth 2 (two) times daily.  Marland Kitchen escitalopram (LEXAPRO) 10 MG tablet Take 10 mg by mouth daily.  Marland Kitchen levothyroxine (SYNTHROID) 25 MCG tablet Take 25 mcg by mouth daily before breakfast.   . loperamide (IMODIUM) 2 MG capsule Take 2 mg by mouth every 12 (twelve) hours as needed for diarrhea or loose stools.  Marland Kitchen LORazepam (ATIVAN) 0.5 MG tablet Take 0.25 mg by mouth daily as needed. Take 1/2  tablet (0.25 mg) by mouth every 24 hours as needed for refusal of care, prior to hospital transport. If patient refuses care wait one hour after administration before attempting again  . Menthol-Zinc Oxide (CALMOSEPTINE) 0.44-20.6 % OINT Apply 1 application topically See admin instructions. Apply topically to buttocks every day and evening shift for redness  . Nutritional Supplements (NUTRITIONAL DRINK PO) Take 30 mLs by mouth 2 (two) times daily.  . Psyllium Husk 100 % POWD Take 5 mLs by mouth. Qd at bedtime for constipation. Mix with 8oz water  . traMADol (ULTRAM) 50 MG tablet Take 25 mg by mouth every 12 (twelve) hours as needed. 1/2 tab bid prn pain  . traZODone (DESYREL) 50 MG tablet Take 25 mg by mouth at bedtime. 1/2 tab qhs for sleep  . [DISCONTINUED] busPIRone (BUSPAR) 5 MG tablet Take 2.5-5 mg by mouth See admin instructions. Take 1/2 tablet (2.5 mg) by mouth every morning and 1 tablet (5 mg) at bedtime, may also take 1 tablet (5 mg) every 12 hours as needed for anxiety  .  [DISCONTINUED] Melatonin 5 MG TABS Take 5 mg by mouth at bedtime.  . [DISCONTINUED] sertraline (ZOLOFT) 25 MG tablet Take 25 mg by mouth See admin instructions. Ordered 02/24/2019: take one tablet (25 mg) by mouth every morning for 2 weeks, then start 50 mg tablet daily  . [DISCONTINUED] sertraline (ZOLOFT) 50 MG tablet Take 50 mg by mouth daily.   No facility-administered encounter medications on file as of 04/29/2019.    PHYSICAL EXAM:   Frail appearing, thin, agitated while getting a bath. Cardiovascular: regular rate and rhythm Pulmonary: clear ant/post fields Abdomen: soft, nontender, + bowel sounds Extremities: no edema, no joint deformities. LE wrapped with compression ace wraps UE with purpura, multi skin tears. Wounds on hands are wrapped. Skin: no rashes Neurological: Weakness but otherwise non-focal  Anselm Lis, NP

## 2019-04-29 ENCOUNTER — Non-Acute Institutional Stay: Payer: Medicare Other | Admitting: Internal Medicine

## 2019-04-29 ENCOUNTER — Other Ambulatory Visit: Payer: Self-pay

## 2019-04-29 VITALS — Ht 64.0 in | Wt 126.2 lb

## 2019-04-29 DIAGNOSIS — Z515 Encounter for palliative care: Secondary | ICD-10-CM

## 2019-04-29 DIAGNOSIS — Z7189 Other specified counseling: Secondary | ICD-10-CM

## 2019-05-01 ENCOUNTER — Encounter: Payer: Self-pay | Admitting: Internal Medicine

## 2019-05-04 ENCOUNTER — Encounter: Payer: Self-pay | Admitting: Internal Medicine

## 2019-05-04 ENCOUNTER — Other Ambulatory Visit: Payer: Self-pay

## 2019-05-04 ENCOUNTER — Non-Acute Institutional Stay: Payer: Medicare Other | Admitting: Internal Medicine

## 2019-05-04 DIAGNOSIS — Z7189 Other specified counseling: Secondary | ICD-10-CM

## 2019-05-04 DIAGNOSIS — Z515 Encounter for palliative care: Secondary | ICD-10-CM

## 2019-05-04 NOTE — Progress Notes (Signed)
March 17th, 2021 [facility] Palliative Care Consult Note Telephone: [phone]  Fax: [phone]  Due to the current COVID-19 infection/crises, the family prefer, and have given their verbal consent for, a provider visit via telemedicine. HIPPA policies of confidentially were discussed. Video-audio (telehealth) contact was unable to be done due technical barriers from the patient's side.  PATIENT NAME: Monica Alvarado DOB: [birth year] MRN: [id number] [facility] RM [address] [facility] (move in date 02/23/2018) PRIMARY CARE PROVIDER:   Reymundo Poll, MD / [facility])  [place] FNP (03/23/2019)  REFERRING PROVIDER:  Reymundo Poll, MD ([phone]. (F) [phone] [address] [place],  [place] [address]  RESPONSIBLE PARTY: (dtr) Cookie Baynes (M) [phone] [email or web address]. (son) Maximino Greenland [phone]. (son) Inez Catalina [phone]. (dtr) Nupur Hohman [phone]  ASSESSMENT / RECOMMENDATIONS:  1. Goals of care planning/discussion with patient's guardian and daughter Tilden Dome; f/u from my visit with patient on 04/29/2019.  We discussed patient's progressive cognitive and functional decline, over these last 15 months, accelerated over the last month or so. We discussed that patient meets hospice eligibility criteria, and is eligible for services. Ms Jearld Adjutant states her goals for her mom are comfort, with a wish to avoid any further aggressive care or hospitalizations. She hopes for her mom to remain at [facility]. Ms. Jearld Adjutant shared that both her brothers are struggling with end stage cancer.  A. Directives: DNR and MOST form in facility chart and [facility] EMR. MOST: DNR/DNI, yes to Antibiotics and IVFs, No to Feeding Tube.    2. Cognitive / Functional status: (copied from previous note: Patient is constantly confused. She is oriented to self only. She strings together non-sensical sentences. He ability to communicate  waxes and wanes, but she usually can communicate her needs. Staff report patient with visual and auditory hallucinations the are sometimes anxiety provoking. She is resistive to bathing and will strike out and attempt to scratch staff. She is followed by Psych nursing and behaviors seem better managed (Escitalopram 10mg , divalproex sodium 125mg  bid, and Ativan 0.5mg  qd prn). She is increasingly somnolent, awake only 4-6 hrs a day.  Patient has had a decline over the last month. Previously able to ambulate with PT and was independent with toileting. Now is non-ambulatory and is a 1-person transfer to wheelchair. She can stand only a few seconds. She needs to be propped up in the wheelchair b/c she will slump to the side. She has had 4 falls this past month. When first admitted 15 months ago she could ambulate independently. She is dependent for hygiene and dressing. She is incontinent of bowel and bladder. Staff repost patient can feed herself finger food, but constant cuing. Last weight (04/13/2019) was 126.2 lbs, which is a decrease of 12.6lbs (9% of her body weight) over the previous 2 months, and 21.8lbs (15% of body weight) over the previous 6 months. At a height of 5'4" her BMI is 21.7kg/m2. She has a laceration to her L middle and index finger from a fall earlier this month.   3. Family Supports: Patient admitted to the facility 3months ago. Involved daughter.    4. Follow up referral to hospice services. I spent 60 minutes providing this consultation from 9:30am to 10:30am. More than 50% of the time in this consultation was spent coordinating communication.   HISTORY OF PRESENT ILLNESS:  Monica Alvarado is a 86 y.o. female with Alzheimer's dementia, falls, major depressive disorder, polyneuropathy,  HTN, GERD, gout, TIA, hypothyroidism. -03/08/2019 ER. Swollen hand then injury. Had repair R hand wound -04/20/2019 ER after fall, repair of laceration L long finger and L index finger. Palliative Care was  asked to help address goals of care.   CODE STATUS: DNR  PPS: 30%  HOSPICE ELIGIBILITY/DIAGNOSIS: TBD  PAST MEDICAL HISTORY:      Past Medical History:  Diagnosis Date  . COVID-19 07/13/2018  . Dementia (HCC)   . Depression   . Edema   . GERD (gastroesophageal reflux disease)   . Gout   . Hypertension   . Hypothyroidism   . Polyneuropathy     SOCIAL HX:  Social History       Tobacco Use  . Smoking status: Not on file  Substance Use Topics  . Alcohol use: Not on file    ALLERGIES: No Known Allergies   PERTINENT MEDICATIONS:      Outpatient Encounter Medications as of 04/29/2019  Medication Sig  . acetaminophen (TYLENOL) 325 MG tablet Take 650 mg by mouth 3 (three) times daily. For joint pain  . allopurinol (ZYLOPRIM) 300 MG tablet Take 300 mg by mouth daily.  Marland Kitchen amLODipine (NORVASC) 10 MG tablet Take 10 mg by mouth daily.  Marland Kitchen aspirin EC 81 MG tablet Take 81 mg by mouth daily.  . cephALEXin (KEFLEX) 500 MG capsule Take 1 capsule (500 mg total) by mouth 2 (two) times daily. (Patient taking differently: Take 500 mg by mouth 2 (two) times daily. 04/27/2019 for 5d)  . cholecalciferol (VITAMIN D3) 25 MCG (1000 UNIT) tablet Take 1,000 Units by mouth daily.  . divalproex (DEPAKOTE) 125 MG DR tablet Take 125 mg by mouth 2 (two) times daily.   Marland Kitchen docusate sodium (COLACE) 100 MG capsule Take 100 mg by mouth 2 (two) times daily.  Marland Kitchen escitalopram (LEXAPRO) 10 MG tablet Take 10 mg by mouth daily.  Marland Kitchen levothyroxine (SYNTHROID) 25 MCG tablet Take 25 mcg by mouth daily before breakfast.   . loperamide (IMODIUM) 2 MG capsule Take 2 mg by mouth every 12 (twelve) hours as needed for diarrhea or loose stools.  Marland Kitchen LORazepam (ATIVAN) 0.5 MG tablet Take 0.25 mg by mouth daily as needed. Take 1/2  tablet (0.25 mg) by mouth every 24 hours as needed for refusal of care, prior to hospital transport. If patient refuses care wait one hour after administration before attempting again  .  Menthol-Zinc Oxide (CALMOSEPTINE) 0.44-20.6 % OINT Apply 1 application topically See admin instructions. Apply topically to buttocks every day and evening shift for redness  . Nutritional Supplements (NUTRITIONAL DRINK PO) Take 30 mLs by mouth 2 (two) times daily.  . Psyllium Husk 100 % POWD Take 5 mLs by mouth. Qd at bedtime for constipation. Mix with 8oz water  . traMADol (ULTRAM) 50 MG tablet Take 25 mg by mouth every 12 (twelve) hours as needed. 1/2 tab bid prn pain  . traZODone (DESYREL) 50 MG tablet Take 25 mg by mouth at bedtime. 1/2 tab qhs for sleep  . [DISCONTINUED] busPIRone (BUSPAR) 5 MG tablet Take 2.5-5 mg by mouth See admin instructions. Take 1/2 tablet (2.5 mg) by mouth every morning and 1 tablet (5 mg) at bedtime, may also take 1 tablet (5 mg) every 12 hours as needed for anxiety  . [DISCONTINUED] Melatonin 5 MG TABS Take 5 mg by mouth at bedtime.  . [DISCONTINUED] sertraline (ZOLOFT) 25 MG tablet Take 25 mg by mouth See admin instructions. Ordered 02/24/2019: take one tablet (25 mg) by  mouth every morning for 2 weeks, then start 50 mg tablet daily  . [DISCONTINUED] sertraline (ZOLOFT) 50 MG tablet Take 50 mg by mouth daily.   No facility-administered encounter medications on file as of 04/29/2019.    PHYSICAL EXAM:   PE deferred d/t tele-health/telephonic nature of visit.  Anselm Lis, NP

## 2019-08-18 DEATH — deceased

## 2021-09-22 IMAGING — CT CT HEAD W/O CM
3 of 4 series · 14 of 47 positions shown, 16 images · non-contrast
Comparison: None.

CLINICAL DATA: Found on floor after fall

EXAM:
CT HEAD WITHOUT CONTRAST
CT CERVICAL SPINE WITHOUT CONTRAST
TECHNIQUE: Multidetector CT imaging of the head and cervical spine was
performed following the standard protocol without intravenous
contrast. Multiplanar CT image reconstructions of the cervical spine
were also generated.

[Series 6: coronal soft tissue · coronal · 0.31mm/px · 3 of 66 slices shown]
[im 22/66  brain]
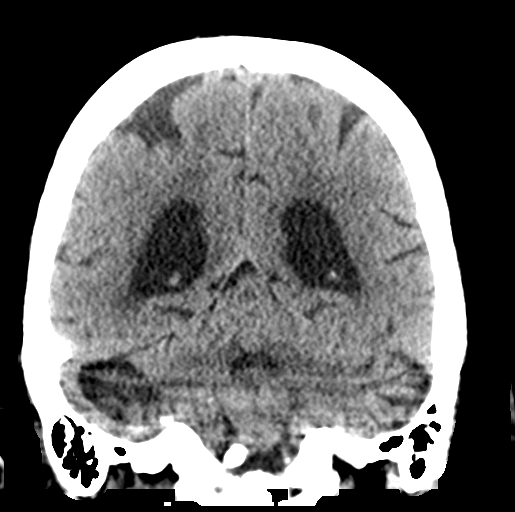
[im 29/66  brain]
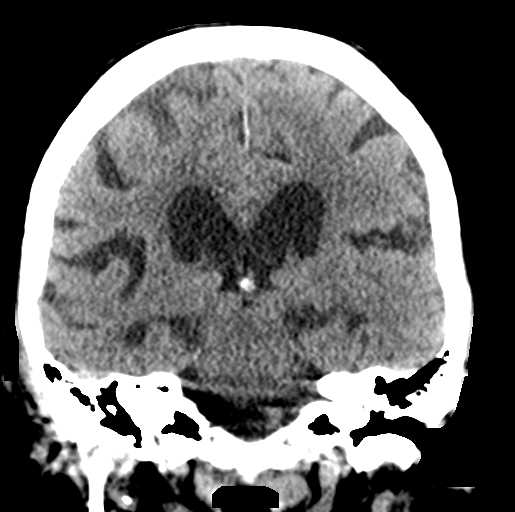
[im 37/66  brain]
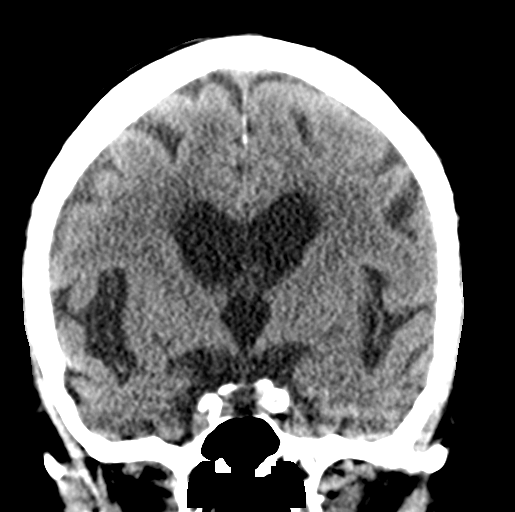

[Series 7: sagittal soft tissue · sagittal · 0.31mm/px · 3 of 54 slices shown]
[im 18/54  brain]
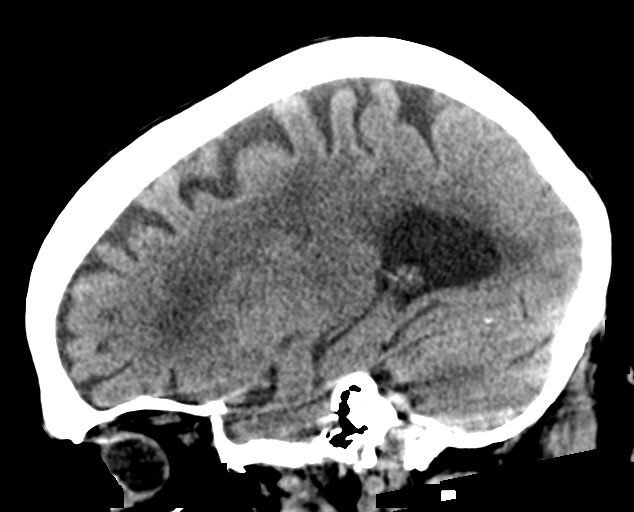
[im 27/54  brain]
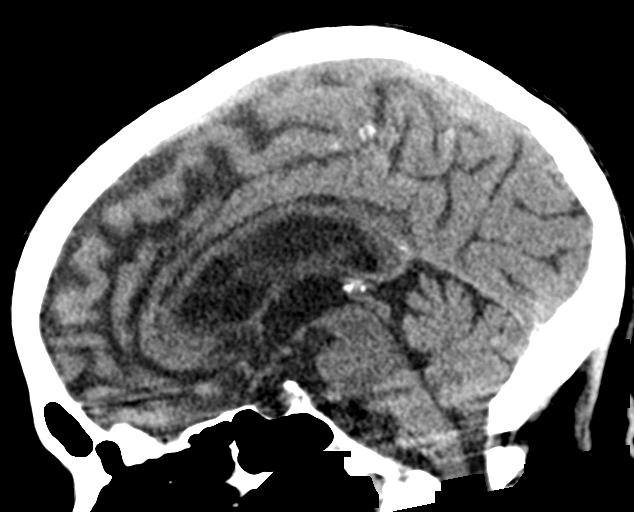
[im 36/54  brain]
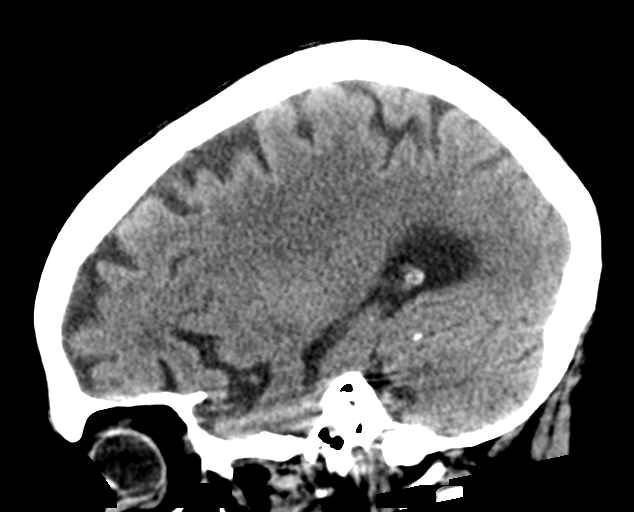

[Series 8: recon axial soft tissue · axial · 0.31mm/px · z∈[+1450,+1573]mm · 8 of 54 slices shown, 10 images]
[im 6/54  brain]
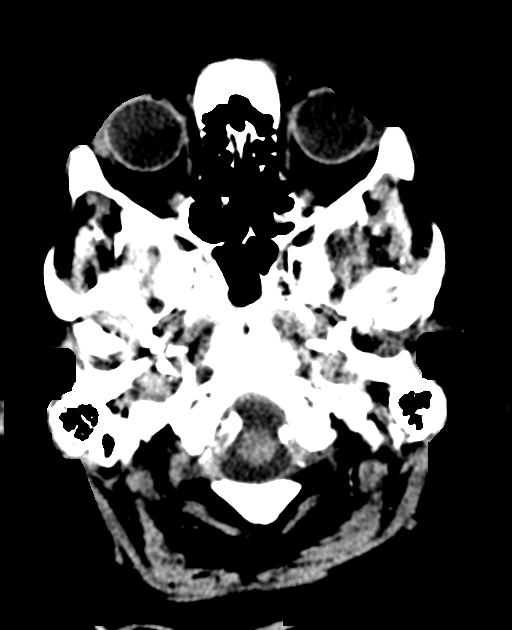
[im 6/54  bone]
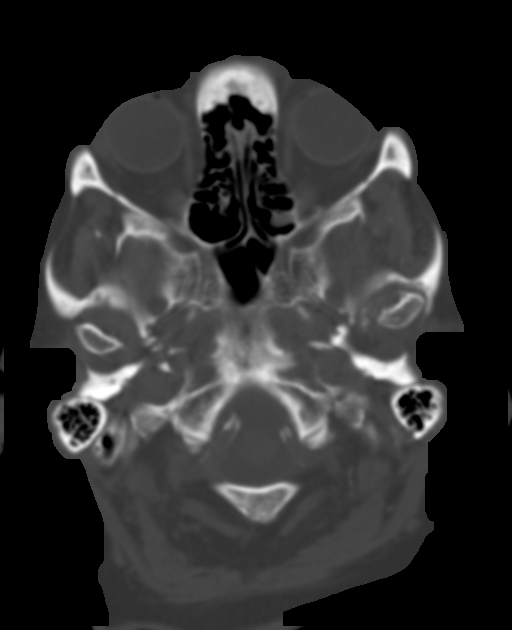
[im 12/54  brain]
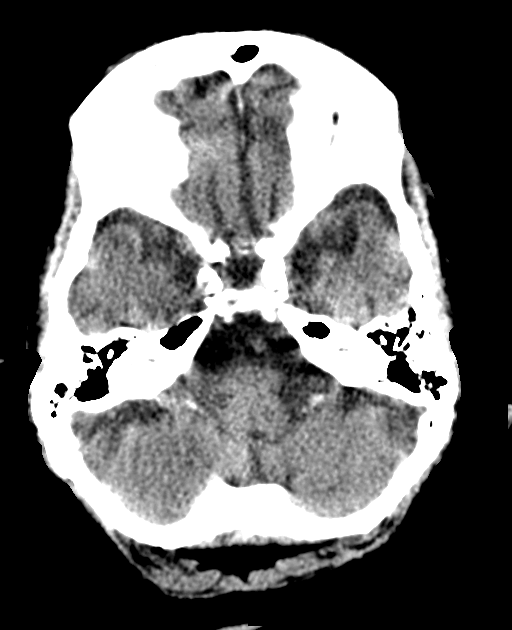
[im 18/54  brain]
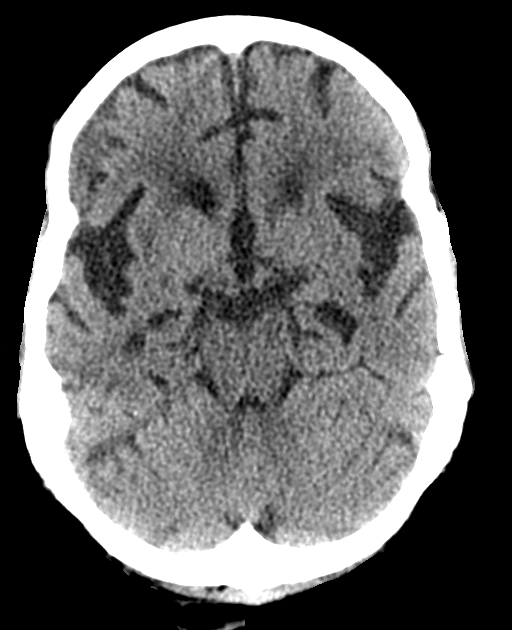
[im 24/54  brain]
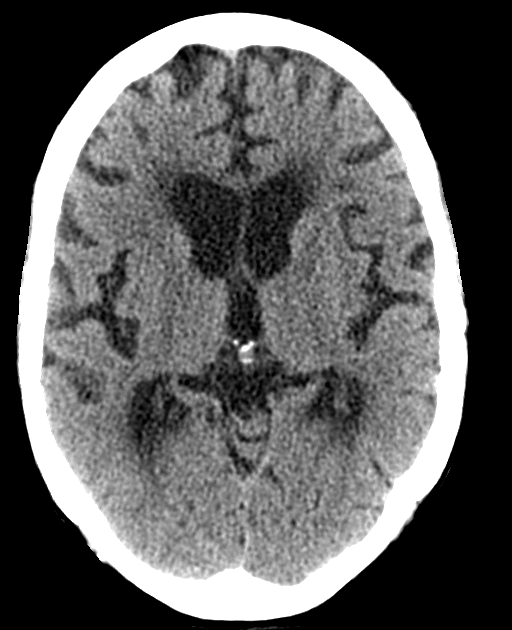
[im 30/54  brain]
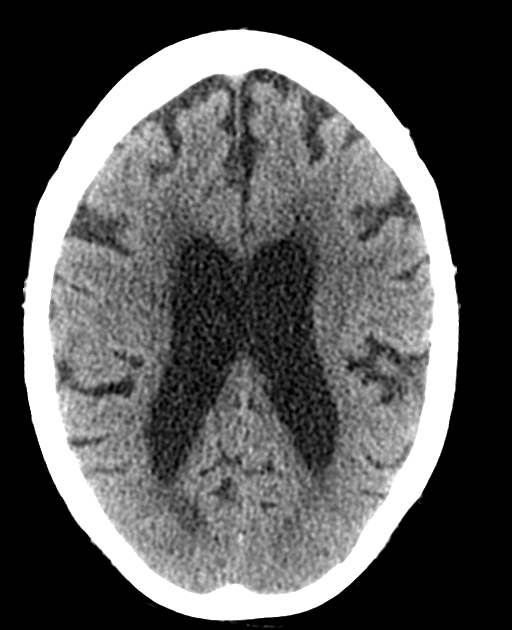
[im 30/54  bone]
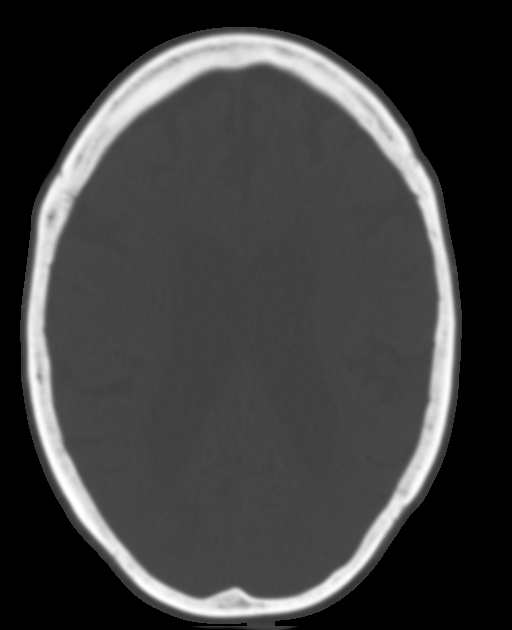
[im 36/54  brain]
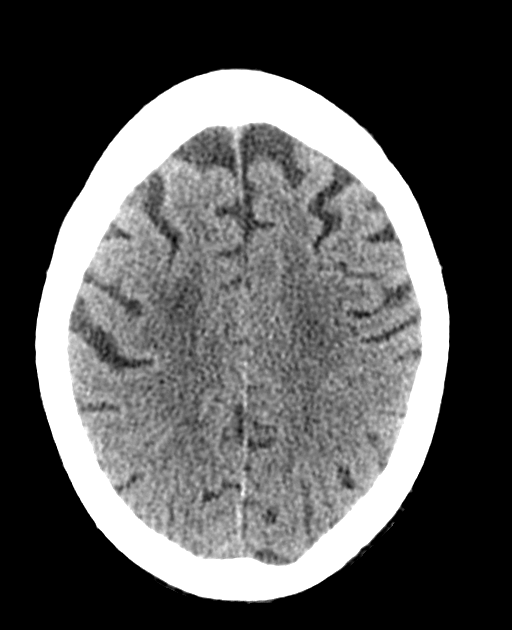
[im 42/54  brain]
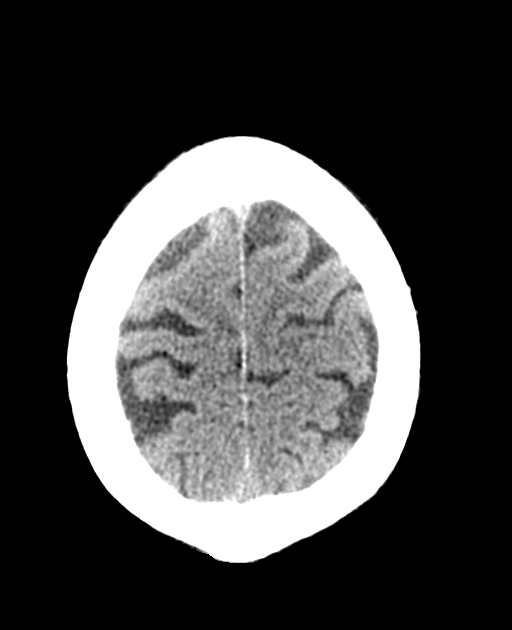
[im 48/54  brain]
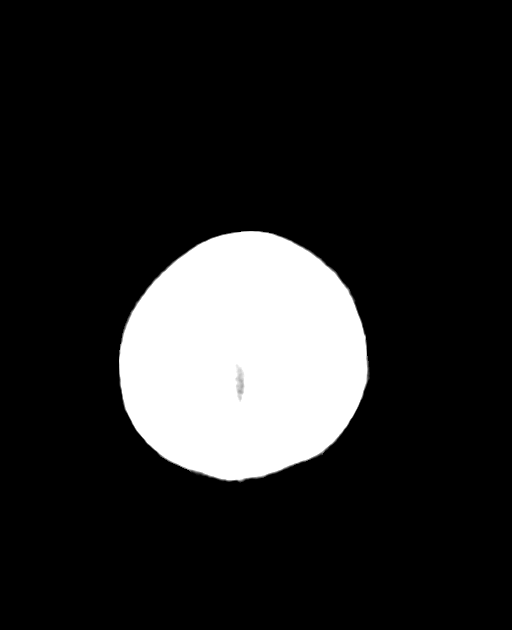

[14 of 47 positions shown; findings below may reference images not displayed]

FINDINGS: CT HEAD FINDINGS

Brain: No acute territorial infarction, hemorrhage or intracranial
mass. Moderate atrophy. Mild hypodensity in the white matter
consistent with chronic small vessel ischemic change. Mild
ventricular enlargement felt secondary to atrophy.

Vascular: No hyperdense vessels. Vertebral and carotid vascular
calcification

Skull: Normal. Negative for fracture or focal lesion.

Sinuses/Orbits: No acute finding.

Other: None

CT CERVICAL SPINE FINDINGS

Alignment: Trace anterolisthesis C4 on C5, likely degenerative.
Facet alignment is normal.

Skull base and vertebrae: No acute fracture. No primary bone lesion
or focal pathologic process.

Soft tissues and spinal canal: No prevertebral fluid or swelling. No
visible canal hematoma.

Disc levels: Mild to moderate degenerative change C4-C5 and C5-C6.
Prominent calcification in the posterior spinal canal on the left at
C4-C5. Facet degenerative changes at multiple levels results in
multiple level foraminal stenosis. Bulky calcified degenerative
change and pannus at the C1-C2 articulation.

Upper chest: Negative.

Other: None
IMPRESSION: 1. No CT evidence for acute intracranial abnormality.
2. Atrophy and chronic small vessel ischemic change of the white
matter
3. Degenerative changes of the cervical spine. No acute osseous
abnormality
